# Patient Record
Sex: Female | Born: 1960 | Race: White | Hispanic: No | Marital: Married | Smoking: Never smoker
Health system: Southern US, Community
[De-identification: ages and names within clinical notes are randomized; demographics above are authoritative.]

## PROBLEM LIST (undated history)

## (undated) DIAGNOSIS — J40 Bronchitis, not specified as acute or chronic: Secondary | ICD-10-CM

## (undated) DIAGNOSIS — J329 Chronic sinusitis, unspecified: Secondary | ICD-10-CM

## (undated) HISTORY — PX: JOINT REPLACEMENT: SHX530

---

## 2008-06-23 ENCOUNTER — Encounter: Admission: RE | Admit: 2008-06-23 | Discharge: 2008-06-23 | Payer: Self-pay | Admitting: Family Medicine

## 2008-07-02 ENCOUNTER — Ambulatory Visit: Payer: Self-pay | Admitting: Pulmonary Disease

## 2008-07-02 DIAGNOSIS — R05 Cough: Secondary | ICD-10-CM | POA: Insufficient documentation

## 2008-07-02 DIAGNOSIS — R059 Cough, unspecified: Secondary | ICD-10-CM | POA: Insufficient documentation

## 2008-07-02 DIAGNOSIS — R079 Chest pain, unspecified: Secondary | ICD-10-CM | POA: Insufficient documentation

## 2014-07-13 ENCOUNTER — Emergency Department (HOSPITAL_COMMUNITY): Payer: Federal, State, Local not specified - PPO

## 2014-07-13 ENCOUNTER — Encounter (HOSPITAL_COMMUNITY): Payer: Self-pay | Admitting: Emergency Medicine

## 2014-07-13 ENCOUNTER — Emergency Department (HOSPITAL_COMMUNITY)
Admission: EM | Admit: 2014-07-13 | Discharge: 2014-07-13 | Disposition: A | Discharge status: Home / Self Care | Payer: Federal, State, Local not specified - PPO | Attending: Emergency Medicine | Admitting: Emergency Medicine

## 2014-07-13 DIAGNOSIS — Z7951 Long term (current) use of inhaled steroids: Secondary | ICD-10-CM | POA: Insufficient documentation

## 2014-07-13 DIAGNOSIS — R519 Headache, unspecified: Secondary | ICD-10-CM

## 2014-07-13 DIAGNOSIS — R000 Tachycardia, unspecified: Secondary | ICD-10-CM | POA: Diagnosis not present

## 2014-07-13 DIAGNOSIS — R05 Cough: Secondary | ICD-10-CM | POA: Diagnosis not present

## 2014-07-13 DIAGNOSIS — Z79899 Other long term (current) drug therapy: Secondary | ICD-10-CM | POA: Insufficient documentation

## 2014-07-13 DIAGNOSIS — J0101 Acute recurrent maxillary sinusitis: Secondary | ICD-10-CM | POA: Diagnosis not present

## 2014-07-13 DIAGNOSIS — R059 Cough, unspecified: Secondary | ICD-10-CM

## 2014-07-13 DIAGNOSIS — R531 Weakness: Secondary | ICD-10-CM | POA: Insufficient documentation

## 2014-07-13 DIAGNOSIS — R51 Headache: Secondary | ICD-10-CM

## 2014-07-13 HISTORY — DX: Chronic sinusitis, unspecified: J32.9

## 2014-07-13 LAB — CBC WITH DIFFERENTIAL/PLATELET
Basophils Absolute: 0 10*3/uL (ref 0.0–0.1)
Basophils Relative: 0 % (ref 0–1)
EOS ABS: 0.1 10*3/uL (ref 0.0–0.7)
EOS PCT: 2 % (ref 0–5)
HCT: 43.6 % (ref 36.0–46.0)
HEMOGLOBIN: 15.8 g/dL — AB (ref 12.0–15.0)
LYMPHS PCT: 28 % (ref 12–46)
Lymphs Abs: 1.7 10*3/uL (ref 0.7–4.0)
MCH: 30.9 pg (ref 26.0–34.0)
MCHC: 36.2 g/dL — ABNORMAL HIGH (ref 30.0–36.0)
MCV: 85.2 fL (ref 78.0–100.0)
Monocytes Absolute: 0.7 10*3/uL (ref 0.1–1.0)
Monocytes Relative: 11 % (ref 3–12)
NEUTROS PCT: 59 % (ref 43–77)
Neutro Abs: 3.6 10*3/uL (ref 1.7–7.7)
Platelets: 257 10*3/uL (ref 150–400)
RBC: 5.12 MIL/uL — ABNORMAL HIGH (ref 3.87–5.11)
RDW: 11.5 % (ref 11.5–15.5)
WBC: 6.1 10*3/uL (ref 4.0–10.5)

## 2014-07-13 LAB — URINALYSIS, ROUTINE W REFLEX MICROSCOPIC
Bilirubin Urine: NEGATIVE
Glucose, UA: NEGATIVE mg/dL
Hgb urine dipstick: NEGATIVE
Ketones, ur: 40 mg/dL — AB
Leukocytes, UA: NEGATIVE
NITRITE: NEGATIVE
PH: 6 (ref 5.0–8.0)
Protein, ur: NEGATIVE mg/dL
SPECIFIC GRAVITY, URINE: 1.013 (ref 1.005–1.030)
UROBILINOGEN UA: 0.2 mg/dL (ref 0.0–1.0)

## 2014-07-13 LAB — BASIC METABOLIC PANEL
ANION GAP: 15 (ref 5–15)
BUN: 13 mg/dL (ref 6–23)
CO2: 26 mEq/L (ref 19–32)
CREATININE: 0.6 mg/dL (ref 0.50–1.10)
Calcium: 9.2 mg/dL (ref 8.4–10.5)
Chloride: 100 mEq/L (ref 96–112)
Glucose, Bld: 95 mg/dL (ref 70–99)
Potassium: 4.4 mEq/L (ref 3.7–5.3)
SODIUM: 141 meq/L (ref 137–147)

## 2014-07-13 MED ORDER — METOCLOPRAMIDE HCL 5 MG/ML IJ SOLN
10.0000 mg | Freq: Once | INTRAMUSCULAR | Status: AC
  Administered 2014-07-13: 10 mg via INTRAVENOUS
  Filled 2014-07-13: qty 2

## 2014-07-13 MED ORDER — SODIUM CHLORIDE 0.9 % IV BOLUS (SEPSIS)
1000.0000 mL | Freq: Once | INTRAVENOUS | Status: AC
  Administered 2014-07-13: 1000 mL via INTRAVENOUS

## 2014-07-13 MED ORDER — DIPHENHYDRAMINE HCL 50 MG/ML IJ SOLN
25.0000 mg | Freq: Once | INTRAMUSCULAR | Status: AC
  Administered 2014-07-13: 25 mg via INTRAVENOUS
  Filled 2014-07-13: qty 1

## 2014-07-13 MED ORDER — LEVOFLOXACIN 750 MG PO TABS: 750.0000 mg | ORAL_TABLET | Freq: Every day | ORAL | Status: DC

## 2014-07-13 MED ORDER — SODIUM CHLORIDE 0.9 % IV SOLN
INTRAVENOUS | Status: DC
  Administered 2014-07-13: 13:00:00 via INTRAVENOUS

## 2014-07-13 NOTE — ED Notes (Addendum)
Pt c/o increasing headache, sinus congestion, sore throat, cough, lack of appetite, and weakness x 11 days.  Pain score 1/10.  Pt has been seen twice at [facility] and diagnosed w/ sinusitis and headache.  Pt was prescribed albuterol inhaler, Flonase, Zyrtec, Z-pack, and prednisone.  Pt has taken medications w/o relief.  Sts she started taking Sudafed x 3 days ago w/ a little relief.  Pt reports losing 10lbs in 1.5 weeks.

## 2014-07-13 NOTE — Discharge Instructions (Signed)

## 2014-07-13 NOTE — ED Provider Notes (Signed)
CSN: [id number]     Arrival date & time 07/13/14  1004 History   First MD Initiated Contact with Patient 07/13/14 1214     Chief Complaint  Patient presents with  . Headache  . Sinus congestion   . Weakness     (Consider location/radiation/quality/duration/timing/severity/associated sxs/prior Treatment) HPI Comments: Patient here complaining of increase sinus headache 11 days. Has been seen at urgent care 3 times with similar symptoms. She has already finished a prescription for albuterol, Flonase, Zyrtec, a Z-Pak and prednisone. Was seen again today because of worsening discomfort and pressure behind her right eye. Was sent here for further evaluation. She denies any diplopia. No fever or chills. No neck pain or photophobia. No postnasal drip noted. Mild nonproductive cough without dyspnea. No urinary symptoms. Nothing makes her symptoms better. No rashes appreciated.  Patient is a 53 y.o. female presenting with headaches and weakness. The history is provided by the patient.  Headache Weakness Associated symptoms include headaches.    Past Medical History  Diagnosis Date  . Sinusitis    History reviewed. No pertinent past surgical history. History reviewed. No pertinent family history. History  Substance Use Topics  . Smoking status: Never Smoker   . Smokeless tobacco: Not on file  . Alcohol Use: No   OB History   Grav Para Term Preterm Abortions TAB SAB Ect Mult Living                 Review of Systems  Neurological: Positive for weakness and headaches.  All other systems reviewed and are negative.     Allergies  Review of patient's allergies indicates no known allergies.  Home Medications   Prior to Admission medications   Medication Sig Start Date End Date Taking? Authorizing Provider  albuterol (PROVENTIL HFA;VENTOLIN HFA) 108 (90 BASE) MCG/ACT inhaler Inhale 1 puff into the lungs 3 (three) times daily.   Yes Historical Provider, MD  cetirizine (ZYRTEC) 10  MG tablet Take 10 mg by mouth daily.   Yes Historical Provider, MD  fluticasone (FLONASE) 50 MCG/ACT nasal spray Place 1 spray into both nostrils daily.   Yes Historical Provider, MD  Multiple Vitamin (MULTIVITAMIN WITH MINERALS) TABS tablet Take 1 tablet by mouth daily.   Yes Historical Provider, MD  Phenylephrine-Acetaminophen (SUDAFED PE PRESSURE + PAIN) 5-325 MG TABS Take 3 tablets by mouth 4 (four) times daily as needed (headache).   Yes Historical Provider, MD   BP 114/67  Pulse 125  Temp(Src) 97.8 F (36.6 C) (Oral)  Resp 18  SpO2 100%  LMP 06/13/2014 Physical Exam  Nursing note and vitals reviewed. Constitutional: She is oriented to person, place, and time. She appears well-developed and well-nourished.  Non-toxic appearance. No distress.  HENT:  Head: Normocephalic and atraumatic.  Nontender at the maxillary and frontal sinuses.  Eyes: Conjunctivae, EOM and lids are normal. Pupils are equal, round, and reactive to light.  Neck: Normal range of motion. Neck supple. No tracheal deviation present. No mass present.  Cardiovascular: Regular rhythm and normal heart sounds.  Tachycardia present.  Exam reveals no gallop.   No murmur heard. Pulmonary/Chest: Effort normal and breath sounds normal. No stridor. No respiratory distress. She has no decreased breath sounds. She has no wheezes. She has no rhonchi. She has no rales.  Abdominal: Soft. Normal appearance and bowel sounds are normal. She exhibits no distension. There is no tenderness. There is no rebound and no CVA tenderness.  Musculoskeletal: Normal range of motion. She exhibits no edema  and no tenderness.  Neurological: She is alert and oriented to person, place, and time. She has normal strength. No cranial nerve deficit or sensory deficit. GCS eye subscore is 4. GCS verbal subscore is 5. GCS motor subscore is 6.  Skin: Skin is warm and dry. No abrasion and no rash noted.  Psychiatric: She has a normal mood and affect. Her speech  is normal and behavior is normal.    ED Course  Procedures (including critical care time) Labs Review Labs Reviewed  URINE CULTURE  CBC WITH DIFFERENTIAL  BASIC METABOLIC PANEL  URINALYSIS, ROUTINE W REFLEX MICROSCOPIC    Imaging Review No results found.   EKG Interpretation None      MDM   Final diagnoses:  Cough  Facial pain    Patient's heart rate noted and likely from mild dehydration. Patient encouraged to drink lots of fluids. We'll treat for sinusitis. Urinalysis without evidence of infection.    Toy BakerAnthony T Marda Breidenbach, MD 07/13/14 [id number]

## 2014-07-15 ENCOUNTER — Encounter (HOSPITAL_COMMUNITY): Payer: Self-pay | Admitting: Emergency Medicine

## 2014-07-15 ENCOUNTER — Emergency Department (HOSPITAL_COMMUNITY)
Admission: EM | Admit: 2014-07-15 | Discharge: 2014-07-15 | Disposition: A | Discharge status: Home / Self Care | Payer: Federal, State, Local not specified - PPO | Attending: Emergency Medicine | Admitting: Emergency Medicine

## 2014-07-15 DIAGNOSIS — T368X5A Adverse effect of other systemic antibiotics, initial encounter: Secondary | ICD-10-CM | POA: Diagnosis not present

## 2014-07-15 DIAGNOSIS — Z7951 Long term (current) use of inhaled steroids: Secondary | ICD-10-CM | POA: Insufficient documentation

## 2014-07-15 DIAGNOSIS — R110 Nausea: Secondary | ICD-10-CM | POA: Insufficient documentation

## 2014-07-15 DIAGNOSIS — R531 Weakness: Secondary | ICD-10-CM | POA: Diagnosis present

## 2014-07-15 DIAGNOSIS — R6883 Chills (without fever): Secondary | ICD-10-CM | POA: Diagnosis not present

## 2014-07-15 DIAGNOSIS — H5500 Unspecified nystagmus: Secondary | ICD-10-CM | POA: Insufficient documentation

## 2014-07-15 DIAGNOSIS — R42 Dizziness and giddiness: Secondary | ICD-10-CM | POA: Diagnosis not present

## 2014-07-15 DIAGNOSIS — Z8709 Personal history of other diseases of the respiratory system: Secondary | ICD-10-CM | POA: Diagnosis not present

## 2014-07-15 DIAGNOSIS — Z79899 Other long term (current) drug therapy: Secondary | ICD-10-CM | POA: Diagnosis not present

## 2014-07-15 DIAGNOSIS — T50905A Adverse effect of unspecified drugs, medicaments and biological substances, initial encounter: Secondary | ICD-10-CM

## 2014-07-15 HISTORY — DX: Bronchitis, not specified as acute or chronic: J40

## 2014-07-15 LAB — URINE CULTURE
COLONY COUNT: NO GROWTH
CULTURE: NO GROWTH

## 2014-07-15 LAB — CBC
HEMATOCRIT: 35.4 % — AB (ref 36.0–46.0)
Hemoglobin: 12.8 g/dL (ref 12.0–15.0)
MCH: 30.5 pg (ref 26.0–34.0)
MCHC: 36.2 g/dL — ABNORMAL HIGH (ref 30.0–36.0)
MCV: 84.3 fL (ref 78.0–100.0)
Platelets: 213 10*3/uL (ref 150–400)
RBC: 4.2 MIL/uL (ref 3.87–5.11)
RDW: 11.5 % (ref 11.5–15.5)
WBC: 4.6 10*3/uL (ref 4.0–10.5)

## 2014-07-15 LAB — URINALYSIS, ROUTINE W REFLEX MICROSCOPIC
BILIRUBIN URINE: NEGATIVE
Glucose, UA: NEGATIVE mg/dL
Hgb urine dipstick: NEGATIVE
Ketones, ur: 40 mg/dL — AB
LEUKOCYTES UA: NEGATIVE
NITRITE: NEGATIVE
PH: 7 (ref 5.0–8.0)
PROTEIN: NEGATIVE mg/dL
Specific Gravity, Urine: 1.02 (ref 1.005–1.030)
UROBILINOGEN UA: 0.2 mg/dL (ref 0.0–1.0)

## 2014-07-15 LAB — BASIC METABOLIC PANEL
Anion gap: 13 (ref 5–15)
BUN: 12 mg/dL (ref 6–23)
CALCIUM: 8.2 mg/dL — AB (ref 8.4–10.5)
CHLORIDE: 105 meq/L (ref 96–112)
CO2: 20 mEq/L (ref 19–32)
CREATININE: 0.5 mg/dL (ref 0.50–1.10)
Glucose, Bld: 103 mg/dL — ABNORMAL HIGH (ref 70–99)
Potassium: 3.3 mEq/L — ABNORMAL LOW (ref 3.7–5.3)
Sodium: 138 mEq/L (ref 137–147)

## 2014-07-15 MED ORDER — AMOXICILLIN-POT CLAVULANATE 875-125 MG PO TABS: 1.0000 | ORAL_TABLET | Freq: Two times a day (BID) | ORAL | Status: DC

## 2014-07-15 MED ORDER — MECLIZINE HCL 25 MG PO TABS
50.0000 mg | ORAL_TABLET | Freq: Once | ORAL | Status: AC
  Administered 2014-07-15: 50 mg via ORAL
  Filled 2014-07-15: qty 2

## 2014-07-15 MED ORDER — LORAZEPAM 0.5 MG PO TABS: 0.5000 mg | ORAL_TABLET | Freq: Three times a day (TID) | ORAL | Status: DC | PRN

## 2014-07-15 MED ORDER — LORAZEPAM 0.5 MG PO TABS
0.5000 mg | ORAL_TABLET | Freq: Once | ORAL | Status: AC
  Administered 2014-07-15: 0.5 mg via ORAL
  Filled 2014-07-15: qty 1

## 2014-07-15 MED ORDER — MECLIZINE HCL 50 MG PO TABS: 50.0000 mg | ORAL_TABLET | Freq: Two times a day (BID) | ORAL | Status: DC | PRN

## 2014-07-15 MED ORDER — SODIUM CHLORIDE 0.9 % IV BOLUS (SEPSIS)
1000.0000 mL | Freq: Once | INTRAVENOUS | Status: AC
  Administered 2014-07-15: 1000 mL via INTRAVENOUS

## 2014-07-15 NOTE — ED Notes (Signed)
Pt able to ambulate well in the hall and to the BR without assistive device--- pt's gait and balance steady.

## 2014-07-15 NOTE — ED Provider Notes (Signed)
Medical screening examination/treatment/procedure(s) were conducted as a shared visit with non-physician practitioner(s) and myself.  I personally evaluated the patient during the encounter.  Well-developed, well-nourished female in no acute distress. Vital signs within normal limits except for mild tachycardia. Patient's symptoms may represent an adverse reaction to her Levaquin. We will discontinue Levaquin and start her on Augmentin and its place. Levaquin has known ability to cause neurologic side effects.    Hanley SeamenJohn L Junella Domke, MD 07/15/14 [id number]

## 2014-07-15 NOTE — ED Notes (Signed)
Per EMS: Pt given z-pack, inhaler, and prednisone for bronchitis on Tuesday, seen again Sunday and given Levoquinn. Tonight around 1800 pt started feeling weakness and dizziness with standing. + orthostatics: 110/60 sitting up with HR 107, 118/80 laying down with HR 96. Pt states she can't stand up. Lung sounds clear.

## 2014-07-15 NOTE — Discharge Instructions (Signed)
Please read and follow all provided instructions.  Your diagnoses today include:  1. Dizziness   2. Medication reaction, initial encounter     Tests performed today include:  EKG - does not show abnormal heart beat but does show some changes (called prolonged QT) that could be caused by the antibiotic you are taking  Blood counts and electrolytes  Urine test  Vital signs. See below for your results today.   Medications prescribed:   Meclizine - medication for dizziness   Ativan - medication for anxiety and dizziness   Augmentin - antibiotic  You have been prescribed an antibiotic medicine: take the entire course of medicine even if you are feeling better. Stopping early can cause the antibiotic not to work.  Take any prescribed medications only as directed.  Home care instructions:  Follow any educational materials contained in this packet.  STOP TAKING the Levaquin.   BE VERY CAREFUL not to take multiple medicines containing Tylenol (also called acetaminophen). Doing so can lead to an overdose which can damage your liver and cause liver failure and possibly death.   Follow-up instructions: Please follow-up with your primary care provider in the next 3 days for further evaluation of your symptoms. You should have your EKG rechecked.   Return instructions:   Please return to the Emergency Department if you experience worsening symptoms.  Return if you have weakness in your arms or legs, slurred speech, trouble walking or talking, confusion, or trouble with your balance.  Return if you pass out or have trouble breathing.   Please return if you have any other emergent concerns.  Additional Information:  Your vital signs today were: BP 122/77   Pulse 101   Temp(Src) 97.5 F (36.4 C) (Oral)   Resp 20   SpO2 100%   LMP 06/13/2014 If your blood pressure (BP) was elevated above 135/85 this visit, please have this repeated by your doctor within one  month. --------------

## 2014-07-15 NOTE — ED Notes (Signed)
Pt reports ate noodles for dinner then took Levaquin, soon after she started feeling sick to her stomach, was lying down and states room started spinning and "she doesn't feel right." Pt denies any pain. Lung sounds clear.

## 2014-07-15 NOTE — ED Provider Notes (Signed)
CSN: [id number]     Arrival date & time 07/15/14  0158 History   First MD Initiated Contact with Patient 07/15/14 0229     Chief Complaint  Patient presents with  . Weakness    (Consider location/radiation/quality/duration/timing/severity/associated sxs/prior Treatment) HPI Comments: Patient with recent treatment for sinusitis, bronchitis with azithromycin; seen in emergency department 2 days ago with continued sinusitis and prescribed Levaquin -- presents after having an episode of dizziness described as 'room spinning' as well as extreme lightheadedness but without full syncope. Patient states that she felt nauseous, was shaking 'on the inside and the outside', felt cold and warm at the same time, unable to walk. Patient called the ambulance for transport to the hospital. She denies any chest pain, shortness of breath, palpitations. Patient denies other signs of stroke including: facial droop, slurred speech, aphasia, weakness/numbness in extremities. The onset of this condition was acute. The course is improved. Aggravating factors: none. Alleviating factors: none.    Patient is a 53 y.o. female presenting with weakness. The history is provided by the patient and medical records.  Weakness Associated symptoms include chills, nausea and weakness. Pertinent negatives include no abdominal pain, chest pain, coughing, fever, headaches, myalgias, numbness, rash, sore throat or vomiting.    Past Medical History  Diagnosis Date  . Sinusitis   . Bronchitis    History reviewed. No pertinent past surgical history. No family history on file. History  Substance Use Topics  . Smoking status: Never Smoker   . Smokeless tobacco: Not on file  . Alcohol Use: No   OB History   Grav Para Term Preterm Abortions TAB SAB Ect Mult Living                 Review of Systems  Constitutional: Positive for chills. Negative for fever.  HENT: Negative for rhinorrhea and sore throat.   Eyes: Negative for  redness.  Respiratory: Negative for cough and shortness of breath.   Cardiovascular: Negative for chest pain and palpitations.  Gastrointestinal: Positive for nausea. Negative for vomiting, abdominal pain and diarrhea.  Genitourinary: Negative for dysuria.  Musculoskeletal: Negative for myalgias.  Skin: Negative for rash.  Neurological: Positive for weakness and light-headedness. Negative for syncope, facial asymmetry, speech difficulty, numbness and headaches.  Psychiatric/Behavioral: The patient is nervous/anxious.    Allergies  Review of patient's allergies indicates no known allergies.  Home Medications   Prior to Admission medications   Medication Sig Start Date End Date Taking? Authorizing Provider  levofloxacin (LEVAQUIN) 750 MG tablet Take 1 tablet (750 mg total) by mouth daily. 07/13/14  Yes Toy Baker, MD  albuterol (PROVENTIL HFA;VENTOLIN HFA) 108 (90 BASE) MCG/ACT inhaler Inhale 1 puff into the lungs 3 (three) times daily.    Historical Provider, MD  cetirizine (ZYRTEC) 10 MG tablet Take 10 mg by mouth daily.    Historical Provider, MD  fluticasone (FLONASE) 50 MCG/ACT nasal spray Place 1 spray into both nostrils daily.    Historical Provider, MD  Multiple Vitamin (MULTIVITAMIN WITH MINERALS) TABS tablet Take 1 tablet by mouth daily.    Historical Provider, MD  Phenylephrine-Acetaminophen (SUDAFED PE PRESSURE + PAIN) 5-325 MG TABS Take 3 tablets by mouth 4 (four) times daily as needed (headache).    Historical Provider, MD   BP 118/76  Pulse 92  Temp(Src) 97.5 F (36.4 C) (Oral)  Resp 24  SpO2 100%  LMP 06/13/2014  Physical Exam  Nursing note and vitals reviewed. Constitutional: She is oriented to person, place,  and time. She appears well-developed and well-nourished.  HENT:  Head: Normocephalic and atraumatic.  Right Ear: Tympanic membrane, external ear and ear canal normal.  Left Ear: Tympanic membrane, external ear and ear canal normal.  Nose: Mucosal edema  and rhinorrhea present. Right sinus exhibits maxillary sinus tenderness and frontal sinus tenderness. Left sinus exhibits maxillary sinus tenderness and frontal sinus tenderness.  Mouth/Throat: Uvula is midline, oropharynx is clear and moist and mucous membranes are normal.  Eyes: Conjunctivae, EOM and lids are normal. Pupils are equal, round, and reactive to light. Right eye exhibits no discharge. Left eye exhibits no discharge. Right eye exhibits no nystagmus. Left eye exhibits no nystagmus.  Mild nystagmus with rightward gaze.   Neck: Normal range of motion. Neck supple.  Cardiovascular: Normal rate, regular rhythm and normal heart sounds.   No murmur heard. Pulmonary/Chest: Effort normal and breath sounds normal. No respiratory distress. She has no wheezes. She has no rales.  Abdominal: Soft. There is no tenderness.  Musculoskeletal:       Cervical back: She exhibits normal range of motion, no tenderness and no bony tenderness.  Neurological: She is alert and oriented to person, place, and time. She has normal strength and normal reflexes. No cranial nerve deficit or sensory deficit. She displays a negative Romberg sign. Coordination normal. GCS eye subscore is 4. GCS verbal subscore is 5. GCS motor subscore is 6.  Unable to test gait at time of exam due to dizziness.   Skin: Skin is warm and dry.  Psychiatric: She has a normal mood and affect.    ED Course  Procedures (including critical care time) Labs Review Labs Reviewed  CBC - Abnormal; Notable for the following:    HCT 35.4 (*)    MCHC 36.2 (*)    All other components within normal limits  BASIC METABOLIC PANEL - Abnormal; Notable for the following:    Potassium 3.3 (*)    Glucose, Bld 103 (*)    Calcium 8.2 (*)    All other components within normal limits  URINALYSIS, ROUTINE W REFLEX MICROSCOPIC - Abnormal; Notable for the following:    Ketones, ur 40 (*)    All other components within normal limits    Imaging  Review Dg Chest 2 View  07/13/2014   CLINICAL DATA:  Cough, sinus congestion comes for throat, weakness and decreased appetite, as well as headache, for approximately 11 days.  EXAM: CHEST  2 VIEW  COMPARISON:  07/02/2008.  FINDINGS: Cardiac silhouette is normal in size. Normal mediastinal and hilar contours.  Lungs are hyperexpanded but clear. No pleural effusion or pneumothorax.  Bony thorax is intact.  IMPRESSION: No acute cardiopulmonary disease.   Electronically Signed   By: Amie Portlandavid  Ormond M.D.   On: 07/13/2014 13:05   Ct Maxillofacial  Ltd Wo Cm  07/13/2014   CLINICAL DATA:  Increasing headache, sinus congestion, sore throat, cough, weakness, and lack of appetitefor 11 days, diagnosed with headache and sinusitis recently  EXAM: CT PARANASAL SINUSES LIMITED WITHOUT CONTRAST  TECHNIQUE: Multidetector non contiguous coronal CT imaging of the paranasal sinuses was performed.  COMPARISON:  None  FINDINGS: No BB placed on the RIGHT side of the face to confirm laterality.  Exam is interpreted with labeling presumed to be correct.  Nasal septal deviation to the RIGHT.  Small amount of fluid dependently in LEFT maxillary sinus.  Scattered mucosal thickening and frontal sinus and a few ethmoid air cells bilaterally.  Remaining sinuses clear.  No fracture  or bone destruction.  Visualized intracranial structures grossly unremarkable.  No facial soft tissue abnormalities.  Slight patient motion noted.  IMPRESSION: Mild sinus disease changes as above.   Electronically Signed   By: Ulyses SouthwardMark  Boles M.D.   On: 07/13/2014 13:02     EKG Interpretation None      2:47 AM Patient seen and examined. Work-up initiated. Medications ordered.   Vital signs reviewed and are as follows: BP 118/76  Pulse 92  Temp(Src) 97.5 F (36.4 C) (Oral)  Resp 24  SpO2 100%  LMP 06/13/2014   Date: 07/15/2014  Rate: 97  Rhythm: normal sinus rhythm  QRS Axis: normal  Intervals: QT prolonged  ST/T Wave abnormalities: nonspecific  T wave changes  Conduction Disutrbances:none  Narrative Interpretation:   Old EKG Reviewed: none available   3:20 AM Prolonged QTc noted on EKG. Nurse reports that patient very dizzy when standing for orthostatics so will treat with meclizine and ativan for dizziness and ambulate later.   5:22 AM Patient feels much better after fluids, meclizine, and ativan. She has ambulated without problems.   Patient was discussed with Dr. Read DriversMolpus who has seen patient. Agrees likely medication reaction.   Pt informed of lab and EKG findings.   She is to discontinue Levaquin immediately due to prolonged QT and possible medication reaction. Will switch to augmentin.   Will give meclizine and small amount of ativan for home for symptoms control.   She is encouraged to see PCP to evaluate effectiveness of treatment and to have EKG rechecked.   Patient urged to return with worsening symptoms or other concerns. Patient verbalized understanding and agrees with plan.     MDM   Final diagnoses:  Dizziness  Medication reaction, initial encounter   Patient with vague symptoms of dizziness and lightheadedness. EKG is abnormal with prolonged QTc and non-specific t-wave changes. She did not have any chest pain to suggest ACS. I doubt arrhythmia caused by prolonged QT and fluoroquinolone -- patient did not have abrupt syncope. Regardless, will discontinue Levaquin. Will have patient f/u with a PCP for recheck. Appropriate return instructions discussed with patient at bedside. She seems reliable to return with worsening.  No dangerous or life-threatening conditions suspected or identified by history, physical exam, and by work-up. No indications for hospitalization identified.      Renne CriglerJoshua Leoncio Hansen, PA-C 07/15/14 [phone]

## 2014-07-15 NOTE — ED Notes (Signed)
Bed: WA10 Expected date: 07/15/14 Expected time: 1:50 AM Means of arrival: Ambulance Comments: 53 yo F  Bronchitis, dizziness

## 2014-07-15 NOTE — ED Notes (Signed)
Patient ambulated to restroom and back. Tolerated it well. Pt states she feels better.

## 2015-12-16 ENCOUNTER — Other Ambulatory Visit: Payer: Self-pay | Admitting: Otolaryngology

## 2015-12-16 DIAGNOSIS — J329 Chronic sinusitis, unspecified: Secondary | ICD-10-CM

## 2015-12-16 DIAGNOSIS — H9072 Mixed conductive and sensorineural hearing loss, unilateral, left ear, with unrestricted hearing on the contralateral side: Secondary | ICD-10-CM

## 2016-03-14 DIAGNOSIS — M21612 Bunion of left foot: Secondary | ICD-10-CM | POA: Diagnosis not present

## 2016-03-14 DIAGNOSIS — M2022 Hallux rigidus, left foot: Secondary | ICD-10-CM | POA: Diagnosis not present

## 2016-03-14 DIAGNOSIS — M79672 Pain in left foot: Secondary | ICD-10-CM | POA: Diagnosis not present

## 2016-03-23 DIAGNOSIS — M21611 Bunion of right foot: Secondary | ICD-10-CM | POA: Diagnosis not present

## 2016-04-06 DIAGNOSIS — K080 Exfoliation of teeth due to systemic causes: Secondary | ICD-10-CM | POA: Diagnosis not present

## 2016-04-18 DIAGNOSIS — M2022 Hallux rigidus, left foot: Secondary | ICD-10-CM | POA: Diagnosis not present

## 2016-04-18 DIAGNOSIS — G8929 Other chronic pain: Secondary | ICD-10-CM | POA: Diagnosis not present

## 2016-04-18 DIAGNOSIS — M21612 Bunion of left foot: Secondary | ICD-10-CM | POA: Diagnosis not present

## 2016-04-18 DIAGNOSIS — M79672 Pain in left foot: Secondary | ICD-10-CM | POA: Diagnosis not present

## 2016-04-28 DIAGNOSIS — K080 Exfoliation of teeth due to systemic causes: Secondary | ICD-10-CM | POA: Diagnosis not present

## 2016-06-26 IMAGING — CT CT PARANASAL SINUSES LIMITED
1 series · 9 of 11 positions shown, 12 images · non-contrast
Comparison: None

CLINICAL DATA: Increasing headache, sinus congestion, sore throat,
cough, weakness, and lack of appetitefor 11 days, diagnosed with
headache and sinusitis recently

EXAM:
CT PARANASAL SINUSES LIMITED WITHOUT CONTRAST
TECHNIQUE: Multidetector non contiguous coronal CT imaging of the paranasal
sinuses was performed.

[Series 3: sinus st · axial · 0.25mm/px · z∈[-89,-9]mm · 9 of 11 slices shown, 12 images]
[im 2/11  brain]
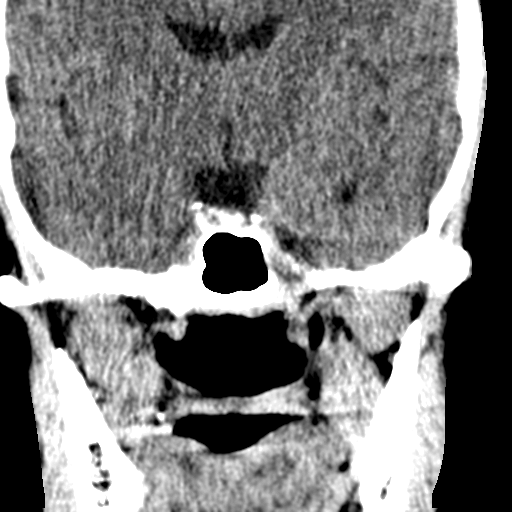
[im 2/11  bone]
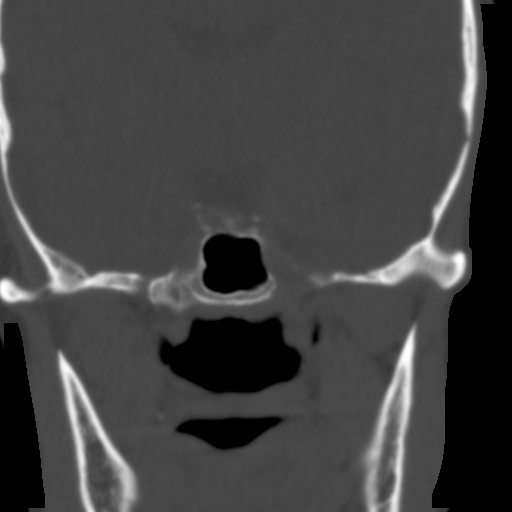
[im 3/11  bone]
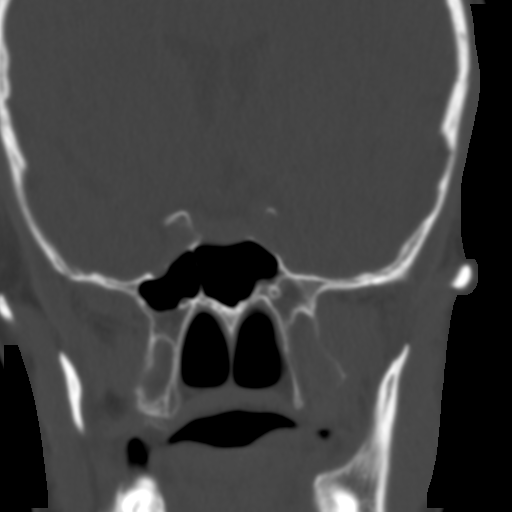
[im 4/11  bone]
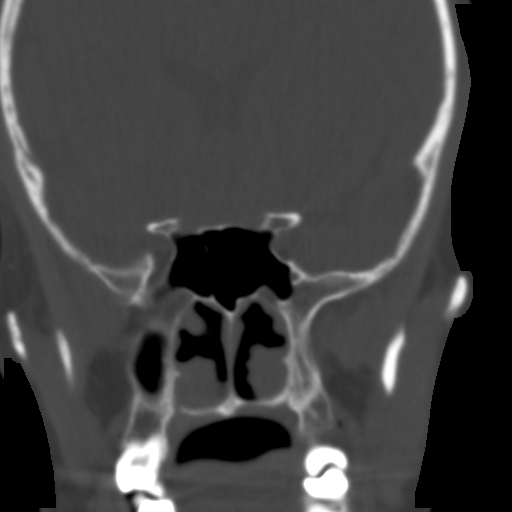
[im 5/11  bone]
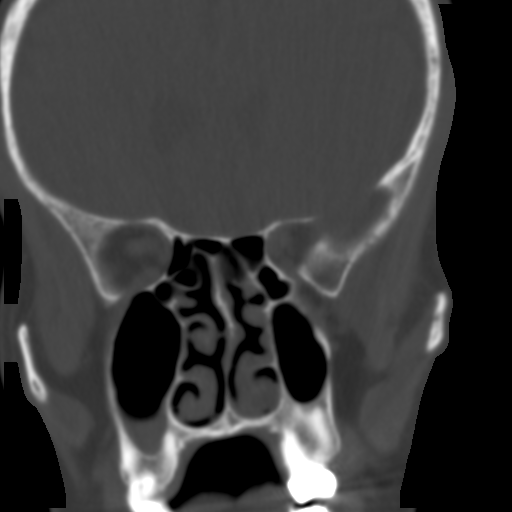
[im 6/11  brain]
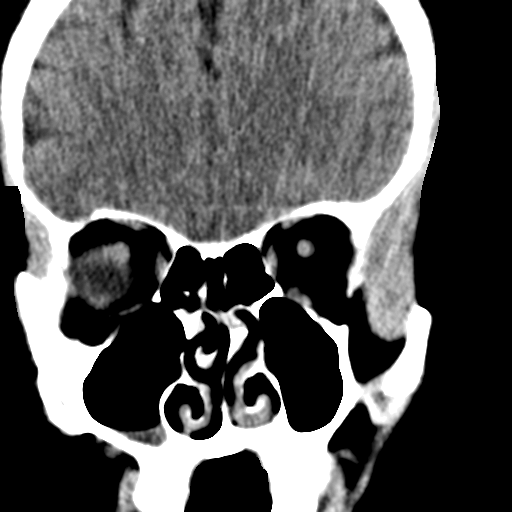
[im 6/11  bone]
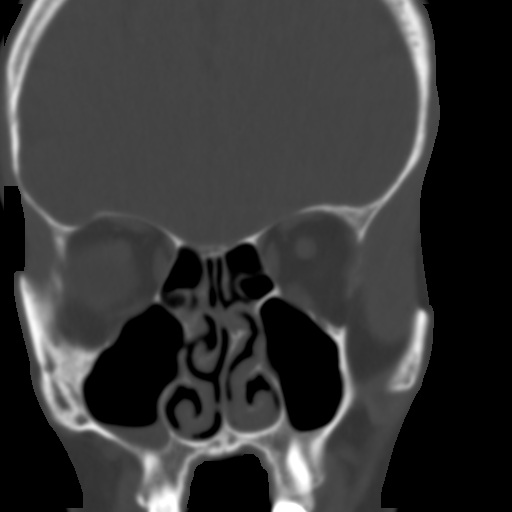
[im 7/11  bone]
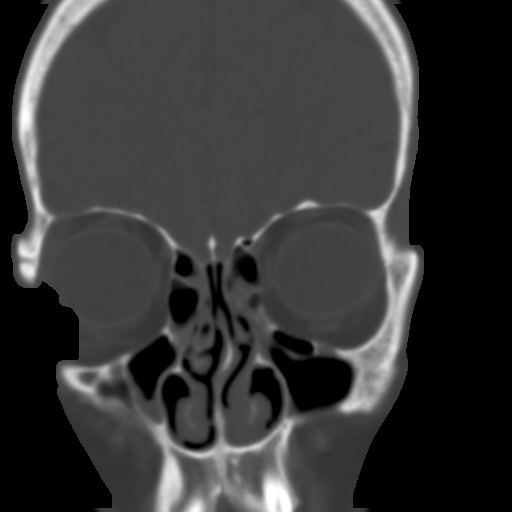
[im 8/11  bone]
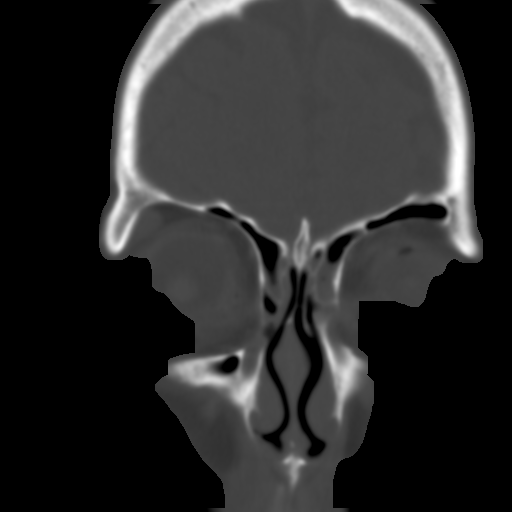
[im 9/11  bone]
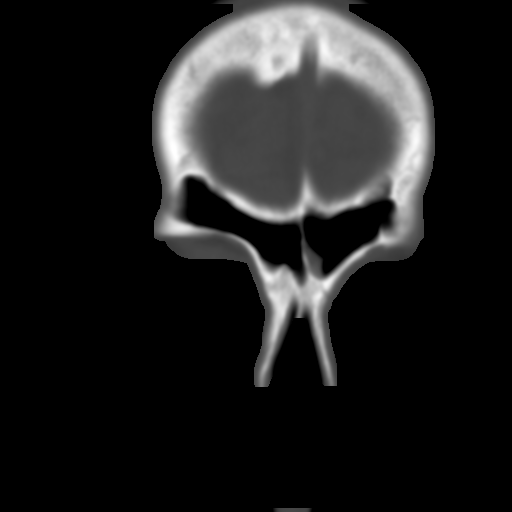
[im 10/11  brain]
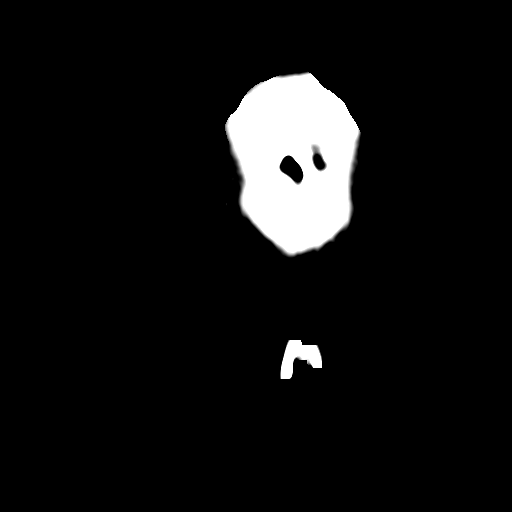
[im 10/11  bone]
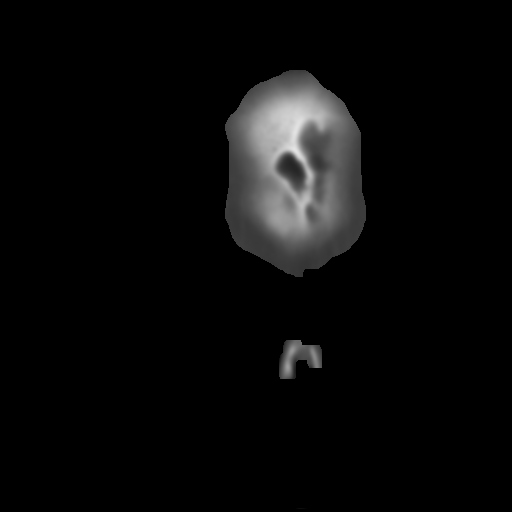

[9 of 11 positions shown; findings below may reference images not displayed]

FINDINGS: No BB placed on the RIGHT side of the face to confirm laterality.

Exam is interpreted with labeling presumed to be correct.

Nasal septal deviation to the RIGHT.

Small amount of fluid dependently in LEFT maxillary sinus.

Scattered mucosal thickening and frontal sinus and a few ethmoid air
cells bilaterally.

Remaining sinuses clear.

No fracture or bone destruction.

Visualized intracranial structures grossly unremarkable.

No facial soft tissue abnormalities.

Slight patient motion noted.
IMPRESSION: Mild sinus disease changes as above.

## 2016-07-20 DIAGNOSIS — M21612 Bunion of left foot: Secondary | ICD-10-CM | POA: Diagnosis not present

## 2016-07-20 DIAGNOSIS — M7742 Metatarsalgia, left foot: Secondary | ICD-10-CM | POA: Diagnosis not present

## 2016-09-06 DIAGNOSIS — M7742 Metatarsalgia, left foot: Secondary | ICD-10-CM | POA: Diagnosis not present

## 2016-09-06 DIAGNOSIS — M2022 Hallux rigidus, left foot: Secondary | ICD-10-CM | POA: Diagnosis not present

## 2016-09-06 DIAGNOSIS — M2042 Other hammer toe(s) (acquired), left foot: Secondary | ICD-10-CM | POA: Diagnosis not present

## 2016-09-06 DIAGNOSIS — G8918 Other acute postprocedural pain: Secondary | ICD-10-CM | POA: Diagnosis not present

## 2016-10-21 DIAGNOSIS — M2022 Hallux rigidus, left foot: Secondary | ICD-10-CM | POA: Diagnosis not present

## 2016-10-21 DIAGNOSIS — D2230 Melanocytic nevi of unspecified part of face: Secondary | ICD-10-CM | POA: Diagnosis not present

## 2016-10-21 DIAGNOSIS — L820 Inflamed seborrheic keratosis: Secondary | ICD-10-CM | POA: Diagnosis not present

## 2016-10-21 DIAGNOSIS — D225 Melanocytic nevi of trunk: Secondary | ICD-10-CM | POA: Diagnosis not present

## 2016-10-21 DIAGNOSIS — L821 Other seborrheic keratosis: Secondary | ICD-10-CM | POA: Diagnosis not present

## 2016-11-21 DIAGNOSIS — M2022 Hallux rigidus, left foot: Secondary | ICD-10-CM | POA: Diagnosis not present

## 2016-12-30 DIAGNOSIS — M79672 Pain in left foot: Secondary | ICD-10-CM | POA: Diagnosis not present

## 2016-12-30 DIAGNOSIS — G8929 Other chronic pain: Secondary | ICD-10-CM | POA: Diagnosis not present

## 2017-01-23 DIAGNOSIS — M79672 Pain in left foot: Secondary | ICD-10-CM | POA: Diagnosis not present

## 2017-01-23 DIAGNOSIS — Z9689 Presence of other specified functional implants: Secondary | ICD-10-CM | POA: Diagnosis not present

## 2017-01-24 DIAGNOSIS — M79672 Pain in left foot: Secondary | ICD-10-CM | POA: Diagnosis not present

## 2017-02-17 DIAGNOSIS — M7989 Other specified soft tissue disorders: Secondary | ICD-10-CM | POA: Diagnosis not present

## 2017-02-17 DIAGNOSIS — M79672 Pain in left foot: Secondary | ICD-10-CM | POA: Diagnosis not present

## 2017-02-17 DIAGNOSIS — Z9689 Presence of other specified functional implants: Secondary | ICD-10-CM | POA: Diagnosis not present

## 2017-02-21 ENCOUNTER — Ambulatory Visit (INDEPENDENT_AMBULATORY_CARE_PROVIDER_SITE_OTHER): Payer: Federal, State, Local not specified - PPO | Admitting: Physician Assistant

## 2017-02-21 ENCOUNTER — Encounter: Payer: Self-pay | Admitting: Physician Assistant

## 2017-02-21 VITALS — BP 108/77 | HR 69 | Temp 97.8°F | Resp 18 | Wt 129.8 lb

## 2017-02-21 DIAGNOSIS — R7989 Other specified abnormal findings of blood chemistry: Secondary | ICD-10-CM | POA: Diagnosis not present

## 2017-02-21 DIAGNOSIS — Z13220 Encounter for screening for lipoid disorders: Secondary | ICD-10-CM

## 2017-02-21 DIAGNOSIS — R5383 Other fatigue: Secondary | ICD-10-CM

## 2017-02-21 DIAGNOSIS — R946 Abnormal results of thyroid function studies: Secondary | ICD-10-CM

## 2017-02-21 DIAGNOSIS — R945 Abnormal results of liver function studies: Secondary | ICD-10-CM

## 2017-02-21 NOTE — Patient Instructions (Signed)
     IF you received an x-ray today, you will receive an invoice from [facility]. Please contact [facility] at [phone] with questions or concerns regarding your invoice.   IF you received labwork today, you will receive an invoice from [facility]. Please contact [facility] at [phone] with questions or concerns regarding your invoice.   Our billing staff will not be able to assist you with questions regarding bills from these companies.  You will be contacted with the lab results as soon as they are available. The fastest way to get your results is to activate your My Chart account. Instructions are located on the last page of this paperwork. If you have not heard from us regarding the results in 2 weeks, please contact this office.     

## 2017-02-21 NOTE — Progress Notes (Signed)
Christine Wise  MRN: [id number] DOB: [birth year]  PCP: Patient, No Pcp Per  Chief Complaint  Patient presents with  . Foot Injury    had surgery in 08/2016   . Thyroid Problem    would like to discuss hypothyroidism is always cold and has puffy face, feet, and fingers   . Rash    on elbows per pt believes all things are due to autoimmune   . Muscle Pain    cramps in calves to feet     Subjective:  Pt presents to clinic for lab discussion.  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  She is still exercising but finds that she is tired with low energy all the time. She has also noticed skin rash (dry extensor elbow surfaces) and fatigue - hard time working out due to low energy, hair loss, cold all the time except at night (no menses for 1.5 years), PND with some intermittent hoarseness.  She has done some research and with the abnormal TSH she thinks that her thyroid could be the cause of her problems.  She is a bad eater - fried foods chips for dinner - does not drink water - drinks sodas and other beverages  Minimally low - Vit D - started Vit D supplement  Working out regular as she did prior to her surgery --   No known tick bites recently but her dog has just been diagnosed with lyme disease.    TSH on 02/16/2017 - 4.91  CBC, ANA, CRP, sed rate, Rh factor, Vit D all neg/normal Uric acid - neg HLA27, CCP antibody, ACE level - neg  She has an appt with rheumatology in July.  Review of Systems  Constitutional: Positive for chills and fatigue. Negative for appetite change and fever.  Endocrine: Positive for cold intolerance and heat intolerance.  Allergic/Immunologic:  Negative for environmental allergies.  Psychiatric/Behavioral: Negative for sleep disturbance.   Does not like to take medications.  There are no active problems to display for this patient.   No current outpatient prescriptions on file prior to visit.   No current facility-administered medications on file prior to visit.     No Known Allergies  Pt patients past, family and social history were reviewed and updated.   Objective:  BP 108/77   Pulse 69   Temp 97.8 F (36.6 C) (Oral)   Resp 18   Wt 129 lb 12.8 oz (58.9 kg)   LMP  (LMP Unknown)   SpO2 98%   Physical Exam  Constitutional: She is oriented to person, place, and time and well-developed, well-nourished, and in no distress.  HENT:  Head: Normocephalic and atraumatic.  Right Ear: Hearing and external ear normal.  Left Ear: Hearing and external ear normal.  Eyes: Conjunctivae are normal.  Neck: Trachea normal and normal range of motion. No thyroid mass and no thyromegaly present.  Cardiovascular: Normal rate, regular rhythm and normal heart sounds.   No murmur heard. Pulmonary/Chest: Effort normal and breath sounds normal. She has no wheezes.  Neurological: She is alert and oriented to person, place, and time. Gait normal.  Skin: Skin is warm and dry.  Psychiatric: Mood, memory, affect and judgment normal.  Vitals reviewed.   Assessment and Plan :  Elevated TSH - Plan: TSH, T4, Free  Fatigue, unspecified type - Plan: Lyme Ab/Western Blot Reflex  Screening, lipid  Elevated LFTs - Plan: CMP14+EGFR  Screening cholesterol level - Plan: Lipid panel   Check some further labs - recheck TSH as it was only slightly elevated but will look closer at the Free T4 - she has never had her cholesterol checked.  F/u after labs return.  Continue with planned rheumatology appt.  Windell Hummingbird PA-C  [facility] 02/21/2017 12:22 PM

## 2017-02-22 ENCOUNTER — Encounter: Payer: Self-pay | Admitting: Physician Assistant

## 2017-02-22 LAB — LYME AB/WESTERN BLOT REFLEX: Lyme IgG/IgM Ab: <0.91 {ISR} (ref 0.00–0.90)

## 2017-02-22 LAB — CMP14+EGFR
ALBUMIN: 4.4 g/dL (ref 3.5–5.5)
ALK PHOS: 62 IU/L (ref 39–117)
ALT: 21 IU/L (ref 0–32)
AST: 31 IU/L (ref 0–40)
Albumin/Globulin Ratio: 2 (ref 1.2–2.2)
BILIRUBIN TOTAL: 0.5 mg/dL (ref 0.0–1.2)
BUN / CREAT RATIO: 17 (ref 9–23)
BUN: 13 mg/dL (ref 6–24)
CHLORIDE: 102 mmol/L (ref 96–106)
CO2: 23 mmol/L (ref 18–29)
Calcium: 9.8 mg/dL (ref 8.7–10.2)
Creatinine, Ser: 0.77 mg/dL (ref 0.57–1.00)
GFR calc Af Amer: 101 mL/min/{1.73_m2} (ref >59)
GFR calc non Af Amer: 88 mL/min/{1.73_m2} (ref >59)
GLOBULIN, TOTAL: 2.2 g/dL (ref 1.5–4.5)
Glucose: 93 mg/dL (ref 65–99)
Potassium: 4.6 mmol/L (ref 3.5–5.2)
SODIUM: 140 mmol/L (ref 134–144)
Total Protein: 6.6 g/dL (ref 6.0–8.5)

## 2017-02-22 LAB — LIPID PANEL
CHOLESTEROL TOTAL: 222 mg/dL — AB (ref 100–199)
Chol/HDL Ratio: 3.1 ratio (ref 0.0–4.4)
HDL: 71 mg/dL (ref >39)
LDL Calculated: 130 mg/dL — ABNORMAL HIGH (ref 0–99)
Triglycerides: 106 mg/dL (ref 0–149)
VLDL Cholesterol Cal: 21 mg/dL (ref 5–40)

## 2017-02-22 LAB — TSH: TSH: 4.24 u[IU]/mL (ref 0.450–4.500)

## 2017-02-22 LAB — T4, FREE: Free T4: 1.47 ng/dL (ref 0.82–1.77)

## 2017-03-07 DIAGNOSIS — M79675 Pain in left toe(s): Secondary | ICD-10-CM | POA: Diagnosis not present

## 2017-03-07 DIAGNOSIS — G8929 Other chronic pain: Secondary | ICD-10-CM | POA: Diagnosis not present

## 2017-03-07 DIAGNOSIS — M25572 Pain in left ankle and joints of left foot: Secondary | ICD-10-CM | POA: Diagnosis not present

## 2017-03-07 DIAGNOSIS — M7989 Other specified soft tissue disorders: Secondary | ICD-10-CM | POA: Diagnosis not present

## 2017-03-30 DIAGNOSIS — M79672 Pain in left foot: Secondary | ICD-10-CM | POA: Diagnosis not present

## 2017-04-05 DIAGNOSIS — S92312A Displaced fracture of first metatarsal bone, left foot, initial encounter for closed fracture: Secondary | ICD-10-CM | POA: Diagnosis not present

## 2017-04-05 DIAGNOSIS — M79672 Pain in left foot: Secondary | ICD-10-CM | POA: Diagnosis not present

## 2017-04-05 DIAGNOSIS — M7989 Other specified soft tissue disorders: Secondary | ICD-10-CM | POA: Diagnosis not present

## 2017-05-03 DIAGNOSIS — S92312D Displaced fracture of first metatarsal bone, left foot, subsequent encounter for fracture with routine healing: Secondary | ICD-10-CM | POA: Diagnosis not present

## 2017-06-14 DIAGNOSIS — S92312D Displaced fracture of first metatarsal bone, left foot, subsequent encounter for fracture with routine healing: Secondary | ICD-10-CM | POA: Diagnosis not present

## 2017-06-27 DIAGNOSIS — S92312D Displaced fracture of first metatarsal bone, left foot, subsequent encounter for fracture with routine healing: Secondary | ICD-10-CM | POA: Diagnosis not present

## 2017-08-25 DIAGNOSIS — M79672 Pain in left foot: Secondary | ICD-10-CM | POA: Diagnosis not present

## 2017-08-25 DIAGNOSIS — S92312D Displaced fracture of first metatarsal bone, left foot, subsequent encounter for fracture with routine healing: Secondary | ICD-10-CM | POA: Diagnosis not present

## 2017-08-25 DIAGNOSIS — M19072 Primary osteoarthritis, left ankle and foot: Secondary | ICD-10-CM | POA: Diagnosis not present

## 2018-03-30 DIAGNOSIS — M13872 Other specified arthritis, left ankle and foot: Secondary | ICD-10-CM | POA: Diagnosis not present

## 2018-03-30 DIAGNOSIS — M25572 Pain in left ankle and joints of left foot: Secondary | ICD-10-CM | POA: Diagnosis not present

## 2018-05-30 ENCOUNTER — Other Ambulatory Visit: Payer: Self-pay

## 2018-05-30 ENCOUNTER — Ambulatory Visit (HOSPITAL_COMMUNITY)
Admission: EM | Admit: 2018-05-30 | Discharge: 2018-05-30 | Disposition: A | Discharge status: Home / Self Care | Payer: Federal, State, Local not specified - PPO | Attending: Family Medicine | Admitting: Family Medicine

## 2018-05-30 ENCOUNTER — Encounter (HOSPITAL_COMMUNITY): Payer: Self-pay

## 2018-05-30 DIAGNOSIS — L237 Allergic contact dermatitis due to plants, except food: Secondary | ICD-10-CM

## 2018-05-30 MED ORDER — METHYLPREDNISOLONE SODIUM SUCC 125 MG IJ SOLR
80.0000 mg | Freq: Once | INTRAMUSCULAR | Status: AC
  Administered 2018-05-30: 80 mg via INTRAMUSCULAR

## 2018-05-30 MED ORDER — PREDNISONE 10 MG (21) PO TBPK: ORAL_TABLET | Freq: Every day | ORAL | 0 refills | Status: AC

## 2018-05-30 MED ORDER — METHYLPREDNISOLONE SODIUM SUCC 125 MG IJ SOLR
INTRAMUSCULAR | Status: AC
  Filled 2018-05-30: qty 2

## 2018-05-30 NOTE — ED Triage Notes (Signed)
Pt has a rash all over. Pt states she was working in the yard.

## 2018-05-30 NOTE — ED Provider Notes (Signed)
[facility]    CSN: [id number] Arrival date & time: 05/30/18  1447     History   Chief Complaint Chief Complaint  Patient presents with  . Rash    HPI Christine Wise is a 57 y.o. female.   Patient is a 57 year old female presents with widespread rash to bilateral lower extremities, upper extremities and left side of neck.  This started after clearing some brush from the back of her yard.  Reports that she actually held the brush when she was caring it touching her entire body.  The rash has been constant with worsening and spreading.  Severe pruritus. She has been using benadryl cream and benadryl PO with minimal relief.  Denies any fever, joint pain. Denies any recent changes in lotions, detergents, foods or other possible irritants. No recent travel. Nobody else at home has the rash.   ROS per HPI        Past Medical History:  Diagnosis Date  . Bronchitis   . Sinusitis     There are no active problems to display for this patient.   Past Surgical History:  Procedure Laterality Date  . JOINT REPLACEMENT      OB History   None      Home Medications    Prior to Admission medications   Medication Sig Start Date End Date Taking? Authorizing Provider  cholecalciferol (VITAMIN D) 1000 units tablet Take 2,000 Units by mouth daily.    [provider]  predniSONE (STERAPRED UNI-PAK 21 TAB) 10 MG (21) TBPK tablet Take by mouth daily. 6 tabs  for 2 days,  5 tabs for 2 days,  4 tabs for 2 days, 3 tabs for 2 days, 2 tabs for 2 days,  1 tab  for 2 days 05/30/18   Janace Aris, NP    Family History Family History  Problem Relation Age of Onset  . Diabetes Father   . Heart disease Father   . Parkinson's disease Mother   . Thyroid disease Sister     Social History Social History   Tobacco Use  . Smoking status: Never Smoker  . Smokeless tobacco: Never Used  Substance Use Topics  . Alcohol use: No  . Drug use: No     Allergies   Patient  has no known allergies.   Review of Systems Review of Systems   Physical Exam Triage Vital Signs ED Triage Vitals  Enc Vitals Group     BP 05/30/18 1533 (!) 142/82     Pulse Rate 05/30/18 1533 82     Resp 05/30/18 1533 16     Temp 05/30/18 1533 98.5 F (36.9 C)     Temp Source 05/30/18 1533 Oral     SpO2 05/30/18 1533 98 %     Weight 05/30/18 1549 125 lb (56.7 kg)     Height --      Head Circumference --      Peak Flow --      Pain Score 05/30/18 1549 0     Pain Loc --      Pain Edu? --      Excl. in GC? --    No data found.  Updated Vital Signs BP (!) 142/82 (BP Location: Left Arm)   Pulse 82   Temp 98.5 F (36.9 C) (Oral)   Resp 16   Wt 125 lb (56.7 kg)   LMP  (LMP Unknown)   SpO2 98%   Visual Acuity Right Eye Distance:  Left Eye Distance:   Bilateral Distance:    Right Eye Near:   Left Eye Near:    Bilateral Near:     Physical Exam  Constitutional: She is oriented to person, place, and time. She appears well-developed and well-nourished.  Very pleasant. Non toxic or ill appearing.     HENT:  Head: Normocephalic and atraumatic.  Mouth/Throat: Oropharynx is clear and moist.  Eyes: Pupils are equal, round, and reactive to light. Conjunctivae and EOM are normal.  Neck: Normal range of motion.  Pulmonary/Chest: Effort normal.  Musculoskeletal: Normal range of motion.  Neurological: She is alert and oriented to person, place, and time.  Skin: Skin is warm and dry.  Widespread macular papular vesicular rash. Areas involved include left neck, bilateral LE and UE. Mostly right AC area.   Psychiatric: She has a normal mood and affect.  Nursing note and vitals reviewed.    UC Treatments / Results  Labs (all labs ordered are listed, but only abnormal results are displayed) Labs Reviewed - No data to display  EKG None  Radiology No results found.  Procedures Procedures (including critical care time)  Medications Ordered in UC Medications    methylPREDNISolone sodium succinate (SOLU-MEDROL) 125 mg/2 mL injection 80 mg (80 mg Intramuscular Given 05/30/18 1617)    Initial Impression / Assessment and Plan / UC Course  I have reviewed the triage vital signs and the nursing notes.  Pertinent labs & imaging results that were available during my care of the patient were reviewed by me and considered in my medical decision making (see chart for details).     Poison oak/ivy dermatitis.  Steroid injection in clinic Sent home with steroid Pred pack Follow up as needed for continued or worsening symptoms  Final Clinical Impressions(s) / UC Diagnoses   Final diagnoses:  Poison ivy dermatitis     Discharge Instructions     It was nice meeting you!! Steroid injection in clinic for poison ivy dermatitis.  Prednisone sent to the pharmacy. Start this tomorrow morning.  Benadryl or zyrtec for itching.  Calamine lotion can help also.  Follow up for worsening symptoms.     ED Prescriptions    Medication Sig Dispense Auth. Provider   predniSONE (STERAPRED UNI-PAK 21 TAB) 10 MG (21) TBPK tablet Take by mouth daily. 6 tabs  for 2 days,  5 tabs for 2 days,  4 tabs for 2 days, 3 tabs for 2 days, 2 tabs for 2 days,  1 tab  for 2 days 42 tablet Gautham Hewins A, NP     Controlled Substance Prescriptions [place] Controlled Substance Registry consulted? Not Applicable   Janace Aris, NP 05/30/18 1626

## 2018-05-30 NOTE — Discharge Instructions (Addendum)
It was nice meeting you!! Steroid injection in clinic for poison ivy dermatitis.  Prednisone sent to the pharmacy. Start this tomorrow morning.  Benadryl or zyrtec for itching.  Calamine lotion can help also.  Follow up for worsening symptoms.

## 2018-06-05 ENCOUNTER — Other Ambulatory Visit: Payer: Self-pay

## 2018-06-05 ENCOUNTER — Encounter (HOSPITAL_COMMUNITY): Payer: Self-pay | Admitting: Emergency Medicine

## 2018-06-05 ENCOUNTER — Ambulatory Visit (HOSPITAL_COMMUNITY)
Admission: EM | Admit: 2018-06-05 | Discharge: 2018-06-05 | Disposition: A | Discharge status: Home / Self Care | Payer: Federal, State, Local not specified - PPO | Attending: Family Medicine | Admitting: Family Medicine

## 2018-06-05 DIAGNOSIS — B86 Scabies: Secondary | ICD-10-CM

## 2018-06-05 DIAGNOSIS — L519 Erythema multiforme, unspecified: Secondary | ICD-10-CM

## 2018-06-05 DIAGNOSIS — R21 Rash and other nonspecific skin eruption: Secondary | ICD-10-CM

## 2018-06-05 MED ORDER — DOXYCYCLINE HYCLATE 100 MG PO TABS: 100.0000 mg | ORAL_TABLET | Freq: Two times a day (BID) | ORAL | 0 refills | Status: AC

## 2018-06-05 MED ORDER — IVERMECTIN 3 MG PO TABS: 12.0000 mg | ORAL_TABLET | Freq: Once | ORAL | 0 refills | Status: AC

## 2018-06-05 MED ORDER — TRIAMCINOLONE ACETONIDE 0.1 % EX CREA: 1.0000 "application " | TOPICAL_CREAM | Freq: Two times a day (BID) | CUTANEOUS | 0 refills | Status: DC

## 2018-06-05 NOTE — ED Notes (Signed)
Lengthy discharge.  Patient not agreeable with discharge diagnosis' and epic paperwork

## 2018-06-05 NOTE — ED Provider Notes (Addendum)
[facility]    CSN: [id number] Arrival date & time: 06/05/18  1153     History   Chief Complaint Chief Complaint  Patient presents with  . Rash    HPI Christine Wise is a 57 y.o. female.   [facility] from a visit that occurred on 05/30/2018. The patient reported taking the prescribed medications with minimal relief.    Note from 05/30/18: Patient is a 57 year old female presents with widespread rash to bilateral lower extremities, upper extremities and left side of neck.  This started after clearing some brush from the back of her yard.  Reports that she actually held the brush when she was caring it touching her entire body.  The rash has been constant with worsening and spreading.  Severe pruritus. She has been using benadryl cream and benadryl PO with minimal relief.  Denies any fever, joint pain. Denies any recent changes in lotions, detergents, foods or other possible irritants. No recent travel. Nobody else at home has the rash.      Past Medical History:  Diagnosis Date  . Bronchitis   . Sinusitis     There are no active problems to display for this patient.   Past Surgical History:  Procedure Laterality Date  . JOINT REPLACEMENT      OB History   None      Home Medications    Prior to Admission medications   Medication Sig Start Date End Date Taking? Authorizing Provider  cholecalciferol (VITAMIN D) 1000 units tablet Take 2,000 Units by mouth daily.   Yes [provider]  diphenhydrAMINE (BENADRYL) 25 mg capsule Take 25 mg by mouth every 6 (six) hours as needed.   Yes [provider]  predniSONE (STERAPRED UNI-PAK 21 TAB) 10 MG (21) TBPK tablet Take by mouth daily. 6 tabs  for 2 days,  5 tabs for 2 days,  4 tabs for 2 days, 3 tabs for 2 days, 2 tabs for 2 days,  1 tab  for 2 days 05/30/18  Yes Bast, Traci A, NP  doxycycline (VIBRA-TABS) 100 MG tablet Take 1 tablet (100 mg total) by mouth  2 (two) times daily. 06/05/18   Elvina SidleLauenstein, Avielle Imbert, MD  ivermectin (STROMECTOL) 3 MG TABS tablet Take 4 tablets (12 mg total) by mouth once for 1 dose. 06/05/18 06/05/18  Elvina SidleLauenstein, Midas Daughety, MD  triamcinolone cream (KENALOG) 0.1 % Apply 1 application topically 2 (two) times daily. 06/05/18   Elvina SidleLauenstein, Adda Stokes, MD    Family History Family History  Problem Relation Age of Onset  . Diabetes Father   . Heart disease Father   . Parkinson's disease Mother   . Thyroid disease Sister     Social History Social History   Tobacco Use  . Smoking status: Never Smoker  . Smokeless tobacco: Never Used  Substance Use Topics  . Alcohol use: No  . Drug use: No     Allergies   Patient has no known allergies.   Review of Systems Review of Systems  Constitutional: Positive for fatigue.  Musculoskeletal: Positive for myalgias.  Skin: Positive for rash.  Neurological: Positive for headaches.  All other systems reviewed and are negative.    Physical Exam Triage Vital Signs ED Triage Vitals  Enc Vitals Group     BP 06/05/18 1242 114/70     Pulse Rate 06/05/18 1242 90     Resp 06/05/18 1242 18     Temp 06/05/18 1242 98.7 F (  37.1 C)     Temp Source 06/05/18 1242 Oral     SpO2 06/05/18 1242 100 %     Weight --      Height --      Head Circumference --      Peak Flow --      Pain Score 06/05/18 1240 0     Pain Loc --      Pain Edu? --      Excl. in GC? --    No data found.  Updated Vital Signs BP 114/70 (BP Location: Left Arm)   Pulse 90   Temp 98.7 F (37.1 C) (Oral)   Resp 18   LMP  (LMP Unknown)   SpO2 100%    Physical Exam             UC Treatments / Results  Labs (all labs ordered are listed, but only abnormal results are displayed) Labs Reviewed - No data to display  EKG None  Radiology No results found.  Procedures Procedures (including critical care time)  Medications Ordered in UC Medications - No data to display  Initial Impression /  Assessment and Plan / UC Course  I have reviewed the triage vital signs and the nursing notes.  Pertinent labs & imaging results that were available during my care of the patient were reviewed by me and considered in my medical decision making (see chart for details).    Final Clinical Impressions(s) / UC Diagnoses   Final diagnoses:  Rash and nonspecific skin eruption     Discharge Instructions     The rash that you are showing is not explained by a diagnosis of poison ivy alone.  It appears that the brush work you did in the woods has elicited this reaction.  The medicines provided cover the most aggressive forms of skin infection and are being given as a precaution.    ED Prescriptions    Medication Sig Dispense Auth. Provider   doxycycline (VIBRA-TABS) 100 MG tablet Take 1 tablet (100 mg total) by mouth 2 (two) times daily. 20 tablet Elvina Sidle, MD   ivermectin (STROMECTOL) 3 MG TABS tablet Take 4 tablets (12 mg total) by mouth once for 1 dose. 4 tablet Elvina Sidle, MD   triamcinolone cream (KENALOG) 0.1 % Apply 1 application topically 2 (two) times daily. 80 g Elvina Sidle, MD     Controlled Substance Prescriptions [place] Controlled Substance Registry consulted? Not Applicable   Elvina Sidle, MD 06/05/18 Sandria Senter, MD 06/05/18 1326

## 2018-06-05 NOTE — ED Notes (Signed)
Notified dr Duane Bostonlauentein

## 2018-06-05 NOTE — ED Triage Notes (Signed)
[facility] from a visit that occurred on 05/30/2018. The patient reported taking the prescribed medications with minimal relief.

## 2018-06-05 NOTE — Discharge Instructions (Addendum)
The rash that you are showing is not explained by a diagnosis of poison ivy alone.  It appears that the brush work you did in the woods has elicited this reaction.  The medicines provided cover the most aggressive forms of skin infection and are being given as a precaution.

## 2018-06-08 ENCOUNTER — Ambulatory Visit (HOSPITAL_COMMUNITY)
Admission: EM | Admit: 2018-06-08 | Discharge: 2018-06-08 | Disposition: A | Discharge status: Home / Self Care | Payer: Federal, State, Local not specified - PPO | Attending: Family Medicine | Admitting: Family Medicine

## 2018-06-08 ENCOUNTER — Encounter (HOSPITAL_COMMUNITY): Payer: Self-pay | Admitting: Emergency Medicine

## 2018-06-08 DIAGNOSIS — R21 Rash and other nonspecific skin eruption: Secondary | ICD-10-CM

## 2018-06-08 MED ORDER — TRIAMCINOLONE ACETONIDE 0.1 % EX CREA: 1.0000 "application " | TOPICAL_CREAM | Freq: Two times a day (BID) | CUTANEOUS | 0 refills | Status: AC

## 2018-06-08 MED ORDER — HYDROXYZINE HCL 25 MG PO TABS: 25.0000 mg | ORAL_TABLET | Freq: Three times a day (TID) | ORAL | 0 refills | Status: AC

## 2018-06-08 NOTE — Discharge Instructions (Addendum)
Use eucerin with topical steroid Follow-up with dermatologist if not improved

## 2018-06-08 NOTE — ED Triage Notes (Signed)
Pt here for the third time for same rash on her legs, has been seen twice for it, taking doxy for possible lymes and steriods.

## 2018-06-08 NOTE — ED Provider Notes (Signed)
[facility]    CSN: [id number] Arrival date & time: 06/08/18  1254     History   Chief Complaint Chief Complaint  Patient presents with  . Appointment    1pm  . Rash  . Follow-up    HPI Christine Wise is a 57 y.o. female.   Third visit for this lady with a rash.  She was seen first on 911 and treated with injectable and topical steroids.  She was seen again on 917 and put on doxycycline for possible tickborne illness.  This all started after she had been in her backyard and did lots of pruning and cutting bushes and weeds.  She does endorse the fact that the rash is somewhat better but still very pruritic there may be some new outbreak.  HPI  Past Medical History:  Diagnosis Date  . Bronchitis   . Sinusitis     There are no active problems to display for this patient.   Past Surgical History:  Procedure Laterality Date  . JOINT REPLACEMENT      OB History   None      Home Medications    Prior to Admission medications   Medication Sig Start Date End Date Taking? Authorizing Provider  cholecalciferol (VITAMIN D) 1000 units tablet Take 2,000 Units by mouth daily.    [provider]  diphenhydrAMINE (BENADRYL) 25 mg capsule Take 25 mg by mouth every 6 (six) hours as needed.    [provider]  doxycycline (VIBRA-TABS) 100 MG tablet Take 1 tablet (100 mg total) by mouth 2 (two) times daily. 06/05/18   Elvina SidleLauenstein, Kurt, MD  hydrOXYzine (ATARAX/VISTARIL) 25 MG tablet Take 1 tablet (25 mg total) by mouth 3 (three) times daily. Take every 8 hrs eg 8am, 4pm, and 12mn 06/08/18   Frederica KusterMiller, Javiel Canepa M, MD  predniSONE (STERAPRED UNI-PAK 21 TAB) 10 MG (21) TBPK tablet Take by mouth daily. 6 tabs  for 2 days,  5 tabs for 2 days,  4 tabs for 2 days, 3 tabs for 2 days, 2 tabs for 2 days,  1 tab  for 2 days 05/30/18   Dahlia ByesBast, Traci A, NP  triamcinolone cream (KENALOG) 0.1 % Apply 1 application topically 2 (two) times daily. 06/08/18   Frederica KusterMiller, Isao Seltzer M, MD     Family History Family History  Problem Relation Age of Onset  . Diabetes Father   . Heart disease Father   . Parkinson's disease Mother   . Thyroid disease Sister     Social History Social History   Tobacco Use  . Smoking status: Never Smoker  . Smokeless tobacco: Never Used  Substance Use Topics  . Alcohol use: No  . Drug use: No     Allergies   Patient has no known allergies.   Review of Systems Review of Systems  Constitutional: Negative.  Negative for chills and fever.  HENT: Negative.  Negative for ear pain and sore throat.   Eyes: Negative for pain and visual disturbance.  Respiratory: Negative for cough and shortness of breath.   Cardiovascular: Negative for chest pain and palpitations.  Gastrointestinal: Negative for abdominal pain and vomiting.  Genitourinary: Negative for dysuria and hematuria.  Musculoskeletal: Negative for arthralgias and back pain.  Skin: Positive for rash. Negative for color change.  Neurological: Negative for seizures and syncope.  All other systems reviewed and are negative.    Physical Exam Triage Vital Signs ED Triage Vitals [06/08/18 1315]  Enc Vitals Group  BP (!) 146/85     Pulse Rate 89     Resp 16     Temp 98.5 F (36.9 C)     Temp src      SpO2 100 %     Weight      Height      Head Circumference      Peak Flow      Pain Score      Pain Loc      Pain Edu?      Excl. in GC?    No data found.  Updated Vital Signs BP (!) 146/85   Pulse 89   Temp 98.5 F (36.9 C)   Resp 16   LMP  (LMP Unknown)   SpO2 100%   Visual Acuity Right Eye Distance:   Left Eye Distance:   Bilateral Distance:    Right Eye Near:   Left Eye Near:    Bilateral Near:     Physical Exam  Constitutional: She appears well-developed and well-nourished.  Skin:  Rash is confined primarily to her lower extremities.  It is urticarial.  It blanches with pressure.  There are also some punctate lesions scattered around.  There  also has been some new outbreak on her wrists and upper arms.     UC Treatments / Results  Labs (all labs ordered are listed, but only abnormal results are displayed) Labs Reviewed - No data to display  EKG None  Radiology No results found.  Procedures Procedures (including critical care time)  Medications Ordered in UC Medications - No data to display  Initial Impression / Assessment and Plan / UC Course  I have reviewed the triage vital signs and the nursing notes.  Pertinent labs & imaging results that were available during my care of the patient were reviewed by me and considered in my medical decision making (see chart for details).     Urticaria, etiology unknown.  Suspect plan or insect exposure.  I think it is improving.  We will add a second antihistamine to current dose.  She is finishing her oral prednisone and doxycycline and will follow up with her dermatologist if not improved over the weekend Final Clinical Impressions(s) / UC Diagnoses   Final diagnoses:  Rash and nonspecific skin eruption     Discharge Instructions     Use eucerin with topical steroid   ED Prescriptions    Medication Sig Dispense Auth. Provider   hydrOXYzine (ATARAX/VISTARIL) 25 MG tablet Take 1 tablet (25 mg total) by mouth 3 (three) times daily. Take every 8 hrs eg 8am, 4pm, and 12mn 12 tablet Frederica Kuster, MD   triamcinolone cream (KENALOG) 0.1 % Apply 1 application topically 2 (two) times daily. 80 g Frederica Kuster, MD     Controlled Substance Prescriptions [place] Controlled Substance Registry consulted? No   Frederica Kuster, MD 06/08/18 1415

## 2018-06-11 DIAGNOSIS — D1801 Hemangioma of skin and subcutaneous tissue: Secondary | ICD-10-CM | POA: Diagnosis not present

## 2018-06-11 DIAGNOSIS — L239 Allergic contact dermatitis, unspecified cause: Secondary | ICD-10-CM | POA: Diagnosis not present

## 2018-08-02 ENCOUNTER — Other Ambulatory Visit: Payer: Self-pay

## 2018-08-02 ENCOUNTER — Ambulatory Visit (HOSPITAL_COMMUNITY)
Admission: EM | Admit: 2018-08-02 | Discharge: 2018-08-02 | Disposition: A | Discharge status: Home / Self Care | Payer: Federal, State, Local not specified - PPO | Attending: Emergency Medicine | Admitting: Emergency Medicine

## 2018-08-02 ENCOUNTER — Encounter (HOSPITAL_COMMUNITY): Payer: Self-pay

## 2018-08-02 ENCOUNTER — Ambulatory Visit (INDEPENDENT_AMBULATORY_CARE_PROVIDER_SITE_OTHER): Payer: Federal, State, Local not specified - PPO

## 2018-08-02 DIAGNOSIS — S20212A Contusion of left front wall of thorax, initial encounter: Secondary | ICD-10-CM

## 2018-08-02 DIAGNOSIS — W548XXA Other contact with dog, initial encounter: Secondary | ICD-10-CM | POA: Insufficient documentation

## 2018-08-02 DIAGNOSIS — R0781 Pleurodynia: Secondary | ICD-10-CM | POA: Diagnosis not present

## 2018-08-02 DIAGNOSIS — J8410 Pulmonary fibrosis, unspecified: Secondary | ICD-10-CM | POA: Diagnosis not present

## 2018-08-02 DIAGNOSIS — Z79899 Other long term (current) drug therapy: Secondary | ICD-10-CM | POA: Diagnosis not present

## 2018-08-02 MED ORDER — ONDANSETRON 4 MG PO TBDP
4.0000 mg | ORAL_TABLET | Freq: Once | ORAL | Status: AC
  Administered 2018-08-02: 4 mg via ORAL

## 2018-08-02 MED ORDER — TRAMADOL HCL 50 MG PO TABS: 50.0000 mg | ORAL_TABLET | Freq: Four times a day (QID) | ORAL | 0 refills | Status: AC | PRN

## 2018-08-02 MED ORDER — ONDANSETRON 4 MG PO TBDP: 4.0000 mg | ORAL_TABLET | Freq: Three times a day (TID) | ORAL | 0 refills | Status: AC | PRN

## 2018-08-02 MED ORDER — ONDANSETRON 4 MG PO TBDP
ORAL_TABLET | ORAL | Status: AC
  Filled 2018-08-02: qty 1

## 2018-08-02 NOTE — ED Triage Notes (Signed)
Pt states she was at home relaxing and her 60 lbs puppy ran in and jumped up the to play with her and landed on her left side. Pt states now she has pain in her left rib area. Pt thinks she reaggregated the area yesterday. She was reaching and the pain got worst.

## 2018-08-02 NOTE — Discharge Instructions (Signed)
Negative xray today which is reassuring.  Pain management is still our goal as your body heals from this injury- bruising and likely some strain to musculature/cartilage  Please avoid use of binding of the ribs.  You may use a pillow as needed to splint the ribs to force a cough. Cough and deep breathing regularly to prevent complications.  Use of incentive spirometer.  Ice application may be helpful.  Activity as tolerated.  If symptoms worsen or do not improve in the next week to return to be seen or to follow up with you PCP.   If develop increased pain, fever, shortness of breath , difficulty breathing or otherwise worsening please go to the Er.

## 2018-08-02 NOTE — ED Provider Notes (Signed)
[facility]    CSN: [id number] Arrival date & time: 08/02/18  0756     History   Chief Complaint Chief Complaint  Patient presents with  . Rib Injury    HPI Christine Wise is a 57 y.o. female.   Christine Wise presents with complaints of left anterior rib pain which started 11/9. She was lying back with her arms up in front of her and her 70lb puppy jumped on her, resulting in immediate pain. She states the then wrapped her ribs for a few days and pain was manageable. Yesterday she reached out of her car window to scan her badge to enter the parking lot, and felt a sudden sharp pain. Pain has since been worse. Unable to sleep last night as laying flat causes pain. Improves with sitting upright. She feels light headed and nauseated. Has taken aleve, yesterday took three at a time. Stomach felt upset so took only two aleve this morn ing. Minimal relief. Pain 6/10 currently, at rest can improve to 4/10, with any movement of left chest wall can spike to 10/10. Feels shortness of breath at times, pain with deep breathing. No bruising. No palpitations. Denies any previous similar. No cough. No fevers. Without contributing medical history.      ROS per HPI.      Past Medical History:  Diagnosis Date  . Bronchitis   . Sinusitis     There are no active problems to display for this patient.   Past Surgical History:  Procedure Laterality Date  . JOINT REPLACEMENT      OB History   None      Home Medications    Prior to Admission medications   Medication Sig Start Date End Date Taking? Authorizing Provider  cholecalciferol (VITAMIN D) 1000 units tablet Take 2,000 Units by mouth daily.    [provider]  diphenhydrAMINE (BENADRYL) 25 mg capsule Take 25 mg by mouth every 6 (six) hours as needed.    [provider]  doxycycline (VIBRA-TABS) 100 MG tablet Take 1 tablet (100 mg total) by mouth 2 (two) times daily. 06/05/18   Elvina Sidle, MD  hydrOXYzine  (ATARAX/VISTARIL) 25 MG tablet Take 1 tablet (25 mg total) by mouth 3 (three) times daily. Take every 8 hrs eg 8am, 4pm, and 12mn 06/08/18   Frederica Kuster, MD  ondansetron (ZOFRAN-ODT) 4 MG disintegrating tablet Take 1 tablet (4 mg total) by mouth every 8 (eight) hours as needed for nausea or vomiting. 08/02/18   Georgetta Haber, NP  predniSONE (STERAPRED UNI-PAK 21 TAB) 10 MG (21) TBPK tablet Take by mouth daily. 6 tabs  for 2 days,  5 tabs for 2 days,  4 tabs for 2 days, 3 tabs for 2 days, 2 tabs for 2 days,  1 tab  for 2 days 05/30/18   Dahlia Byes A, NP  traMADol (ULTRAM) 50 MG tablet Take 1 tablet (50 mg total) by mouth every 6 (six) hours as needed. 08/02/18   Linus Mako B, NP  triamcinolone cream (KENALOG) 0.1 % Apply 1 application topically 2 (two) times daily. 06/08/18   Frederica Kuster, MD    Family History Family History  Problem Relation Age of Onset  . Diabetes Father   . Heart disease Father   . Parkinson's disease Mother   . Thyroid disease Sister     Social History Social History   Tobacco Use  . Smoking status: Never Smoker  . Smokeless tobacco: Never Used  Substance  Use Topics  . Alcohol use: No  . Drug use: No     Allergies   Patient has no known allergies.   Review of Systems Review of Systems   Physical Exam Triage Vital Signs ED Triage Vitals  Enc Vitals Group     BP 08/02/18 0820 127/64     Pulse Rate 08/02/18 0820 82     Resp 08/02/18 0820 16     Temp 08/02/18 0820 98.2 F (36.8 C)     Temp Source 08/02/18 0820 Oral     SpO2 08/02/18 0820 100 %     Weight 08/02/18 0822 125 lb (56.7 kg)     Height --      Head Circumference --      Peak Flow --      Pain Score 08/02/18 0821 8     Pain Loc --      Pain Edu? --      Excl. in GC? --    No data found.  Updated Vital Signs BP 127/64 (BP Location: Right Arm)   Pulse 82   Temp 98.2 F (36.8 C) (Oral)   Resp 16   Wt 125 lb (56.7 kg)   LMP  (LMP Unknown)   SpO2 100%       Physical Exam  Constitutional: She is oriented to person, place, and time. She appears well-developed and well-nourished. No distress.  Cardiovascular: Normal rate, regular rhythm and normal heart sounds.  Pulmonary/Chest: Effort normal and breath sounds normal. She exhibits tenderness and bony tenderness. She exhibits no crepitus, no edema and no swelling.  Significant tenderness over left mid anterior ribs; noted pain with ROM of left arm to left ribs; no increased work of breathing    Neurological: She is alert and oriented to person, place, and time.  Skin: Skin is warm and dry.     UC Treatments / Results  Labs (all labs ordered are listed, but only abnormal results are displayed) Labs Reviewed - No data to display  EKG None  Radiology Dg Ribs Unilateral W/chest Left  Result Date: 08/02/2018 CLINICAL DATA:  Pain after dog jumped on chest EXAM: LEFT RIBS AND CHEST - 3+ VIEW COMPARISON:  Chest radiograph July 13, 2014 FINDINGS: Frontal chest as well as oblique and cone-down rib images obtained. There is a small calcified granuloma in the left base. The lungs elsewhere are clear. Heart size and pulmonary vascularity are normal. No adenopathy. There is no evident pneumothorax or pleural effusion. No rib fracture appreciable. IMPRESSION: No appreciable rib fracture. No edema or consolidation. Small calcified granuloma left lower lobe. Electronically Signed   By: Bretta BangWilliam  Woodruff III M.D.   On: 08/02/2018 09:31    Procedures Procedures (including critical care time)  Medications Ordered in UC Medications  ondansetron (ZOFRAN-ODT) disintegrating tablet 4 mg (4 mg Oral Given 08/02/18 [id number])    Initial Impression / Assessment and Plan / UC Course  I have reviewed the triage vital signs and the nursing notes.  Pertinent labs & imaging results that were available during my care of the patient were reviewed by me and considered in my medical decision making (see chart for  details).     Negative films here today. Contusion vs strain vs combination of both. Supportive cares. Emphasized cough and deep breathing, to avoid use of wrapping chest. Incentive spirometer and instructions provided. Tramadol for prn use. Discussed drowsy precautions. Return precautions provided. Encouraged close follow up. Patient verbalized understanding and agreeable to plan. Ambulatory  out of clinic without difficulty.    Final Clinical Impressions(s) / UC Diagnoses   Final diagnoses:  Rib contusion, left, initial encounter     Discharge Instructions     Negative xray today which is reassuring.  Pain management is still our goal as your body heals from this injury- bruising and likely some strain to musculature/cartilage  Please avoid use of binding of the ribs.  You may use a pillow as needed to splint the ribs to force a cough. Cough and deep breathing regularly to prevent complications.  Use of incentive spirometer.  Ice application may be helpful.  Activity as tolerated.  If symptoms worsen or do not improve in the next week to return to be seen or to follow up with you PCP.   If develop increased pain, fever, shortness of breath , difficulty breathing or otherwise worsening please go to the Er.     ED Prescriptions    Medication Sig Dispense Auth. Provider   traMADol (ULTRAM) 50 MG tablet Take 1 tablet (50 mg total) by mouth every 6 (six) hours as needed. 15 tablet Burky, Dorene Grebe B, NP   ondansetron (ZOFRAN-ODT) 4 MG disintegrating tablet Take 1 tablet (4 mg total) by mouth every 8 (eight) hours as needed for nausea or vomiting. 12 tablet Georgetta Haber, NP     Controlled Substance Prescriptions [place] Controlled Substance Registry consulted? Yes, I have consulted the [place] Controlled Substances Registry for this patient, and feel the risk/benefit ratio today is favorable for proceeding with this prescription for a controlled substance.   Georgetta Haber, NP 08/02/18  1032

## 2018-08-02 NOTE — ED Notes (Signed)
Patient demonstrated use of incentive spirometer and discussed purpose and how to use at home.  Patient demonstrated use and asked appropriate questions.

## 2018-10-23 DIAGNOSIS — W19XXXA Unspecified fall, initial encounter: Secondary | ICD-10-CM | POA: Diagnosis not present

## 2018-10-23 DIAGNOSIS — M25552 Pain in left hip: Secondary | ICD-10-CM | POA: Diagnosis not present

## 2018-10-23 DIAGNOSIS — S32402A Unspecified fracture of left acetabulum, initial encounter for closed fracture: Secondary | ICD-10-CM | POA: Diagnosis not present

## 2018-10-23 DIAGNOSIS — R102 Pelvic and perineal pain: Secondary | ICD-10-CM | POA: Diagnosis not present

## 2018-10-23 DIAGNOSIS — S79912A Unspecified injury of left hip, initial encounter: Secondary | ICD-10-CM | POA: Diagnosis not present

## 2018-10-23 DIAGNOSIS — S32512A Fracture of superior rim of left pubis, initial encounter for closed fracture: Secondary | ICD-10-CM | POA: Diagnosis not present

## 2018-10-26 DIAGNOSIS — S32591A Other specified fracture of right pubis, initial encounter for closed fracture: Secondary | ICD-10-CM | POA: Diagnosis not present
